# Patient Record
Sex: Male | Born: 1955 | Race: White | Hispanic: No | Marital: Married
Health system: Southern US, Community
[De-identification: ages and names within clinical notes are randomized; demographics above are authoritative.]

---

## 2004-01-06 ENCOUNTER — Inpatient Hospital Stay (HOSPITAL_COMMUNITY): Admission: RE | Admit: 2004-01-06 | Discharge: 2004-01-08 | Payer: Self-pay | Admitting: Thoracic Surgery

## 2004-01-19 ENCOUNTER — Encounter: Admission: RE | Admit: 2004-01-19 | Discharge: 2004-01-19 | Payer: Self-pay | Admitting: Thoracic Surgery

## 2005-10-05 IMAGING — CR DG CHEST 2V
2 series · 2 of 2 positions shown · non-contrast
Comparison: none

CLINICAL DATA: Hyperhydrosis.  Preadmission.  Patient for bilateral VATS procedures.  The patient is a nonsmoker.  No chest complaints.  
 TWO VIEW CHEST 
 PA and lateral views of the chest are made without previous films for comparison and show the heart, mediastinum, bony thorax, pulmonary vessels, trachea and bronchi to be within the normal limits.  
 IMPRESSION
 Normal chest without previous films for comparison.

[view not recorded (1 of 2)]
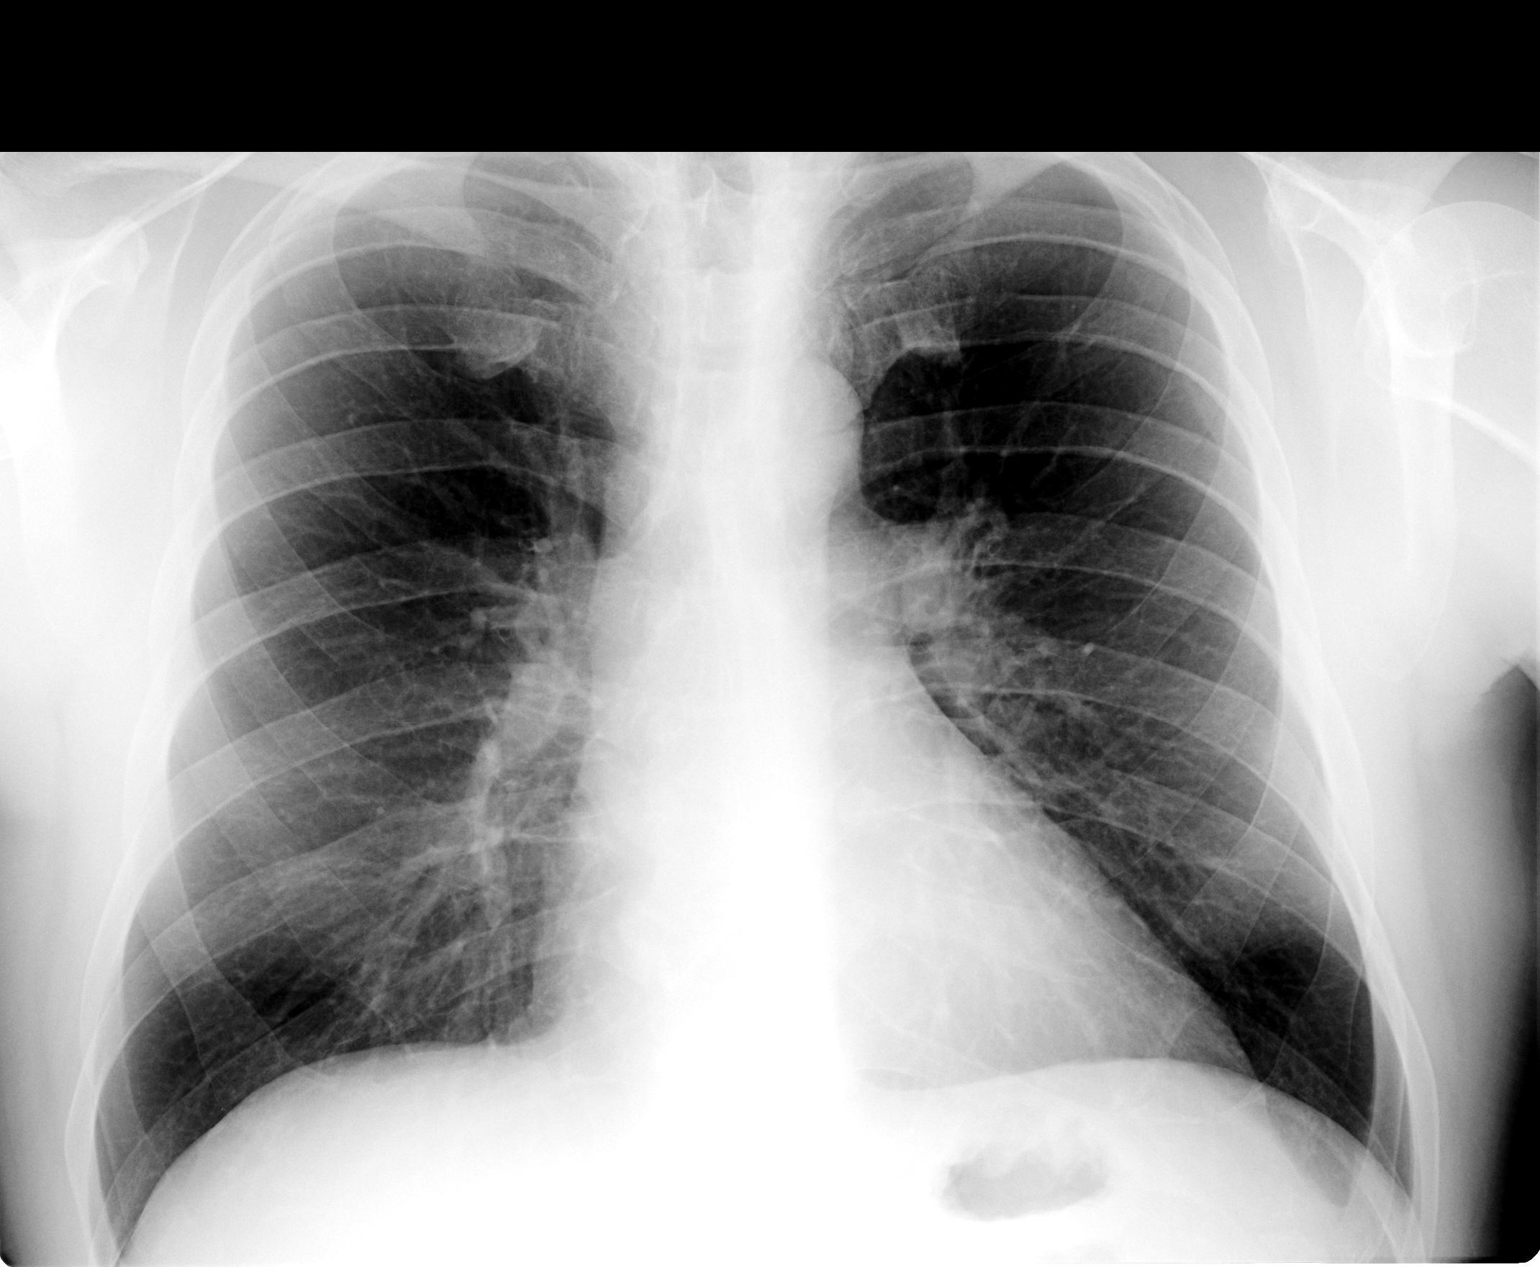

[view not recorded (2 of 2)]
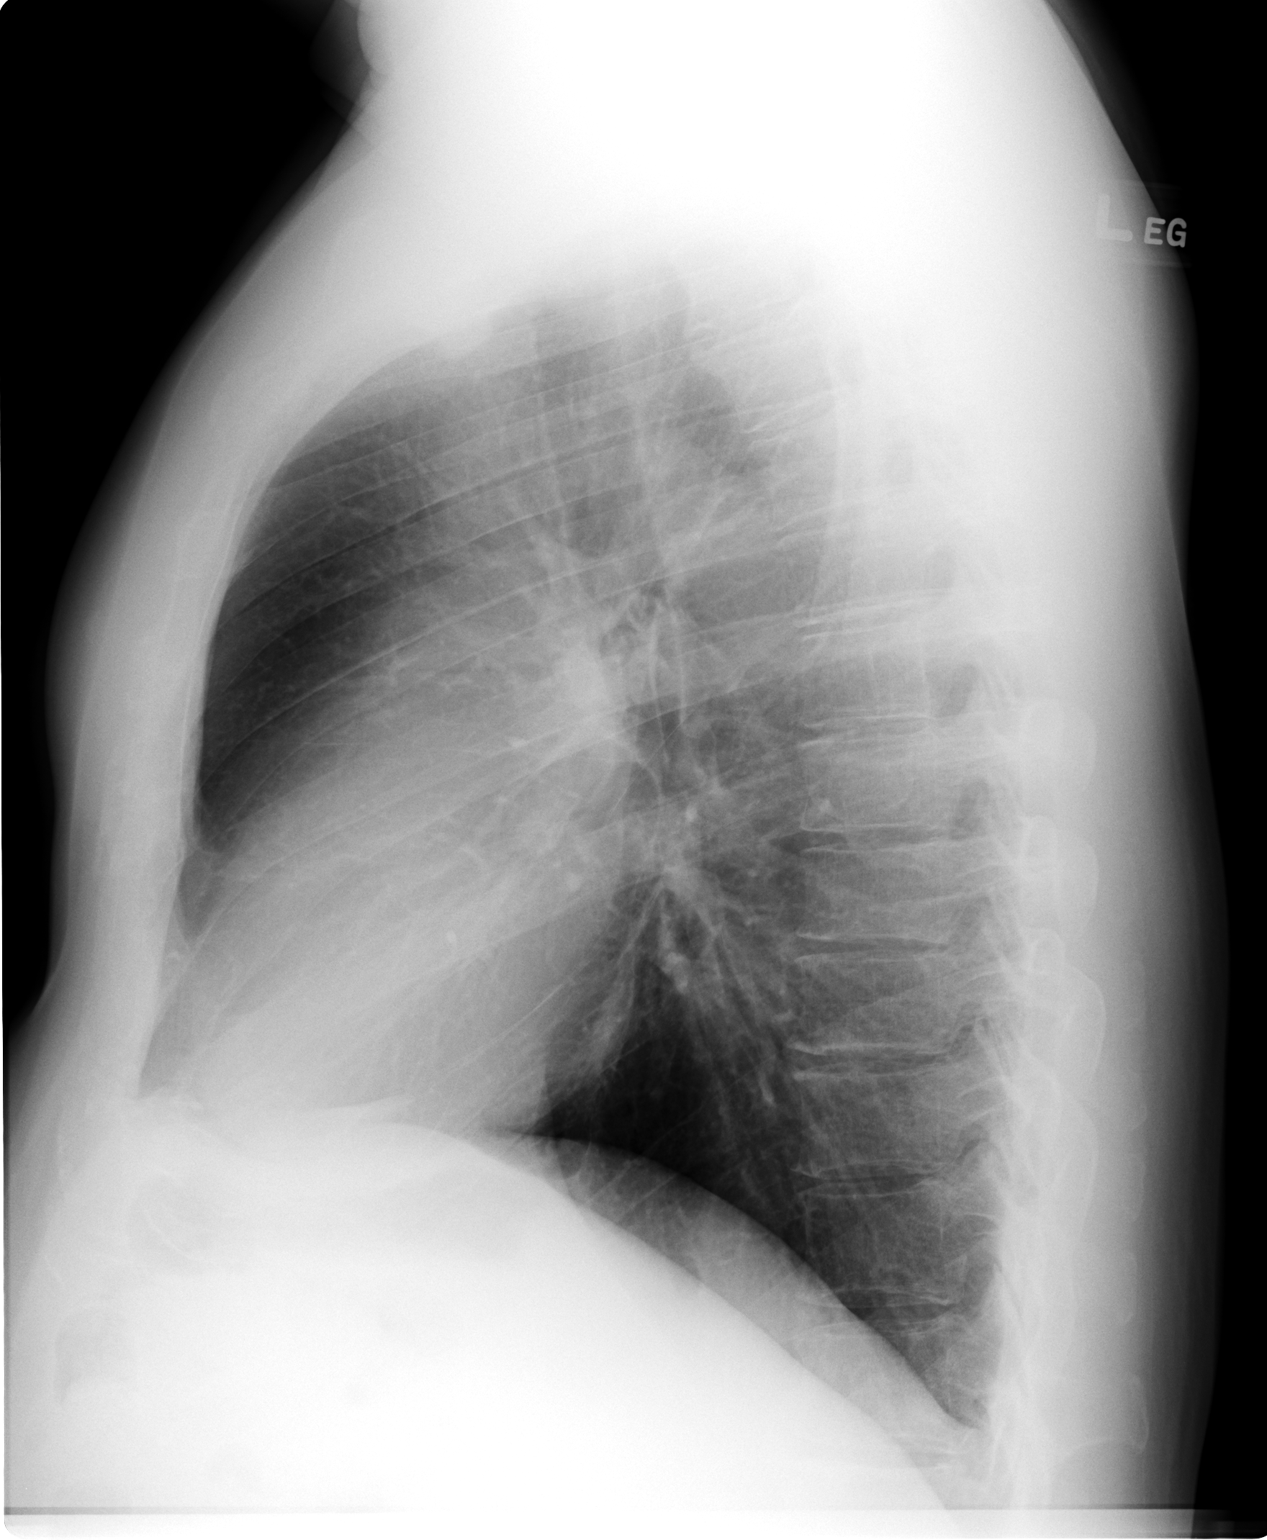

[2 of 2 positions shown; findings below may reference images not displayed]

## 2005-10-09 IMAGING — CR DG CHEST 2V
2 series · 2 of 2 positions shown · non-contrast
Comparison: 01/07/04.

CLINICAL DATA: Status post VATS.  
 CHEST (TWO VIEWS) 01/08/04

[view not recorded (1 of 2)]
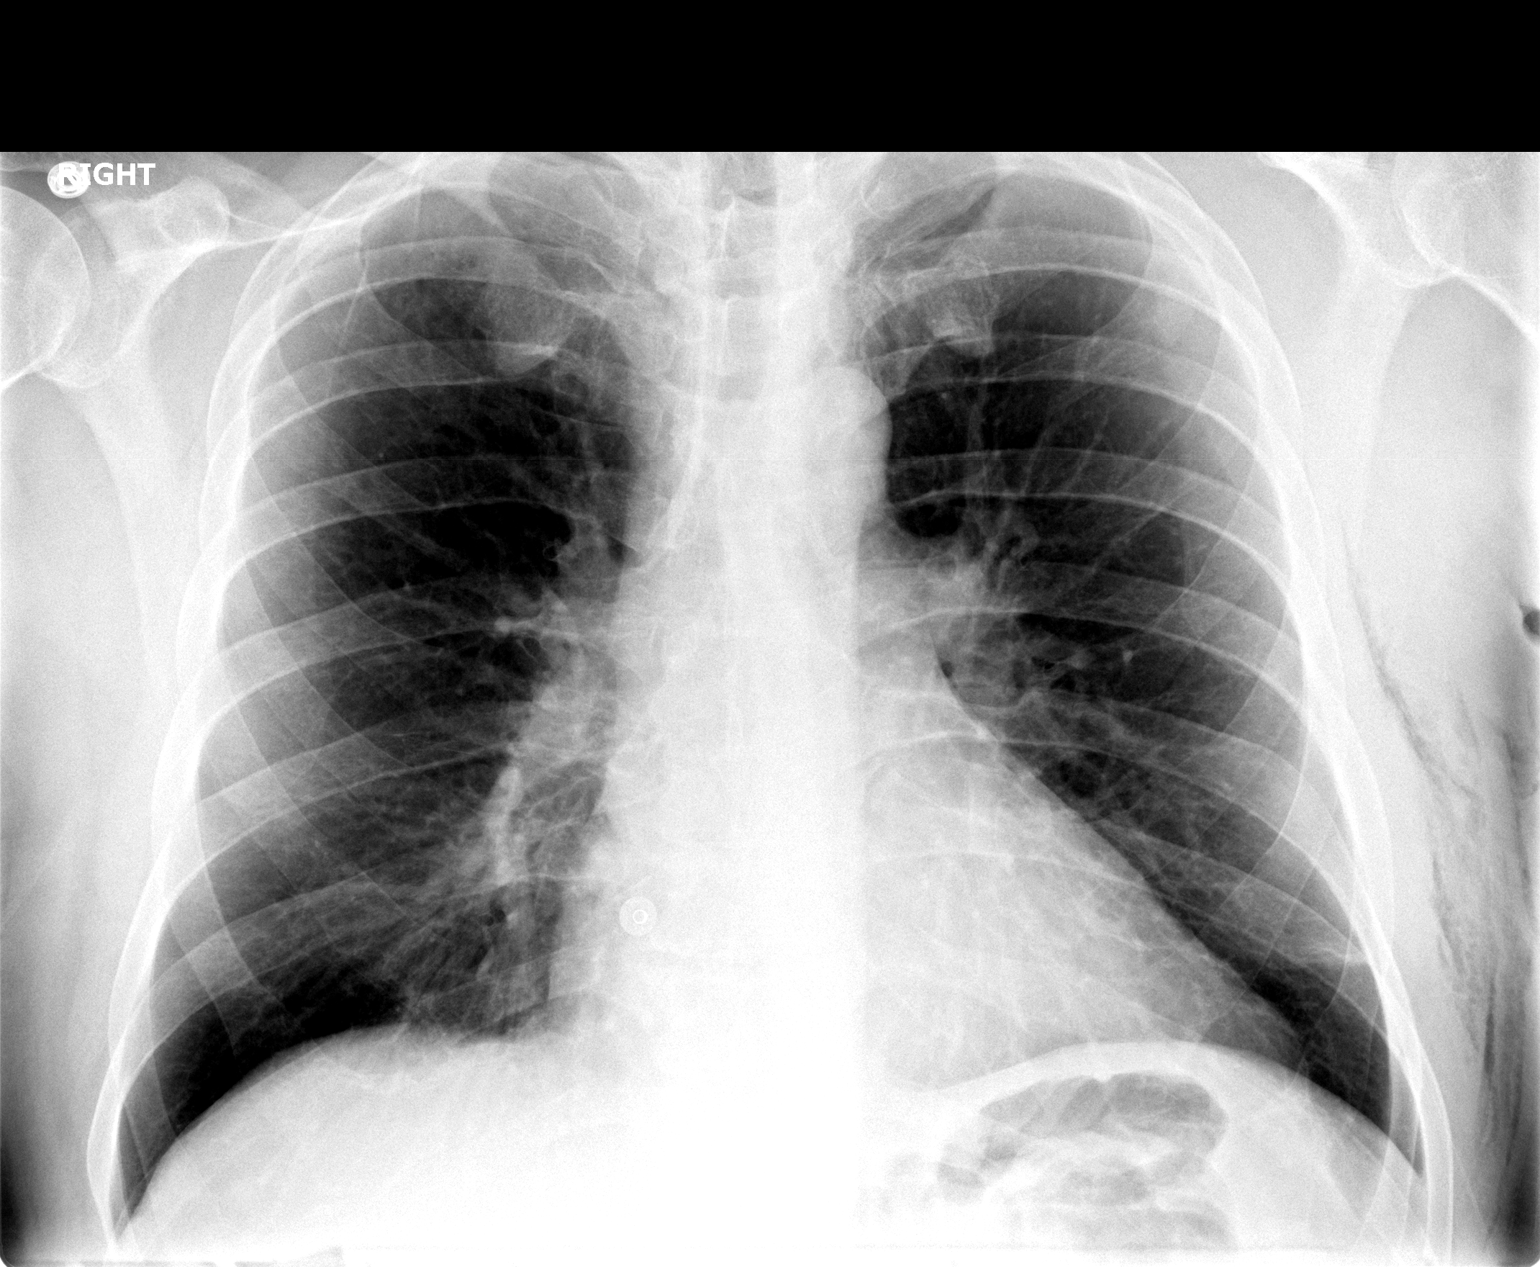

[view not recorded (2 of 2)]
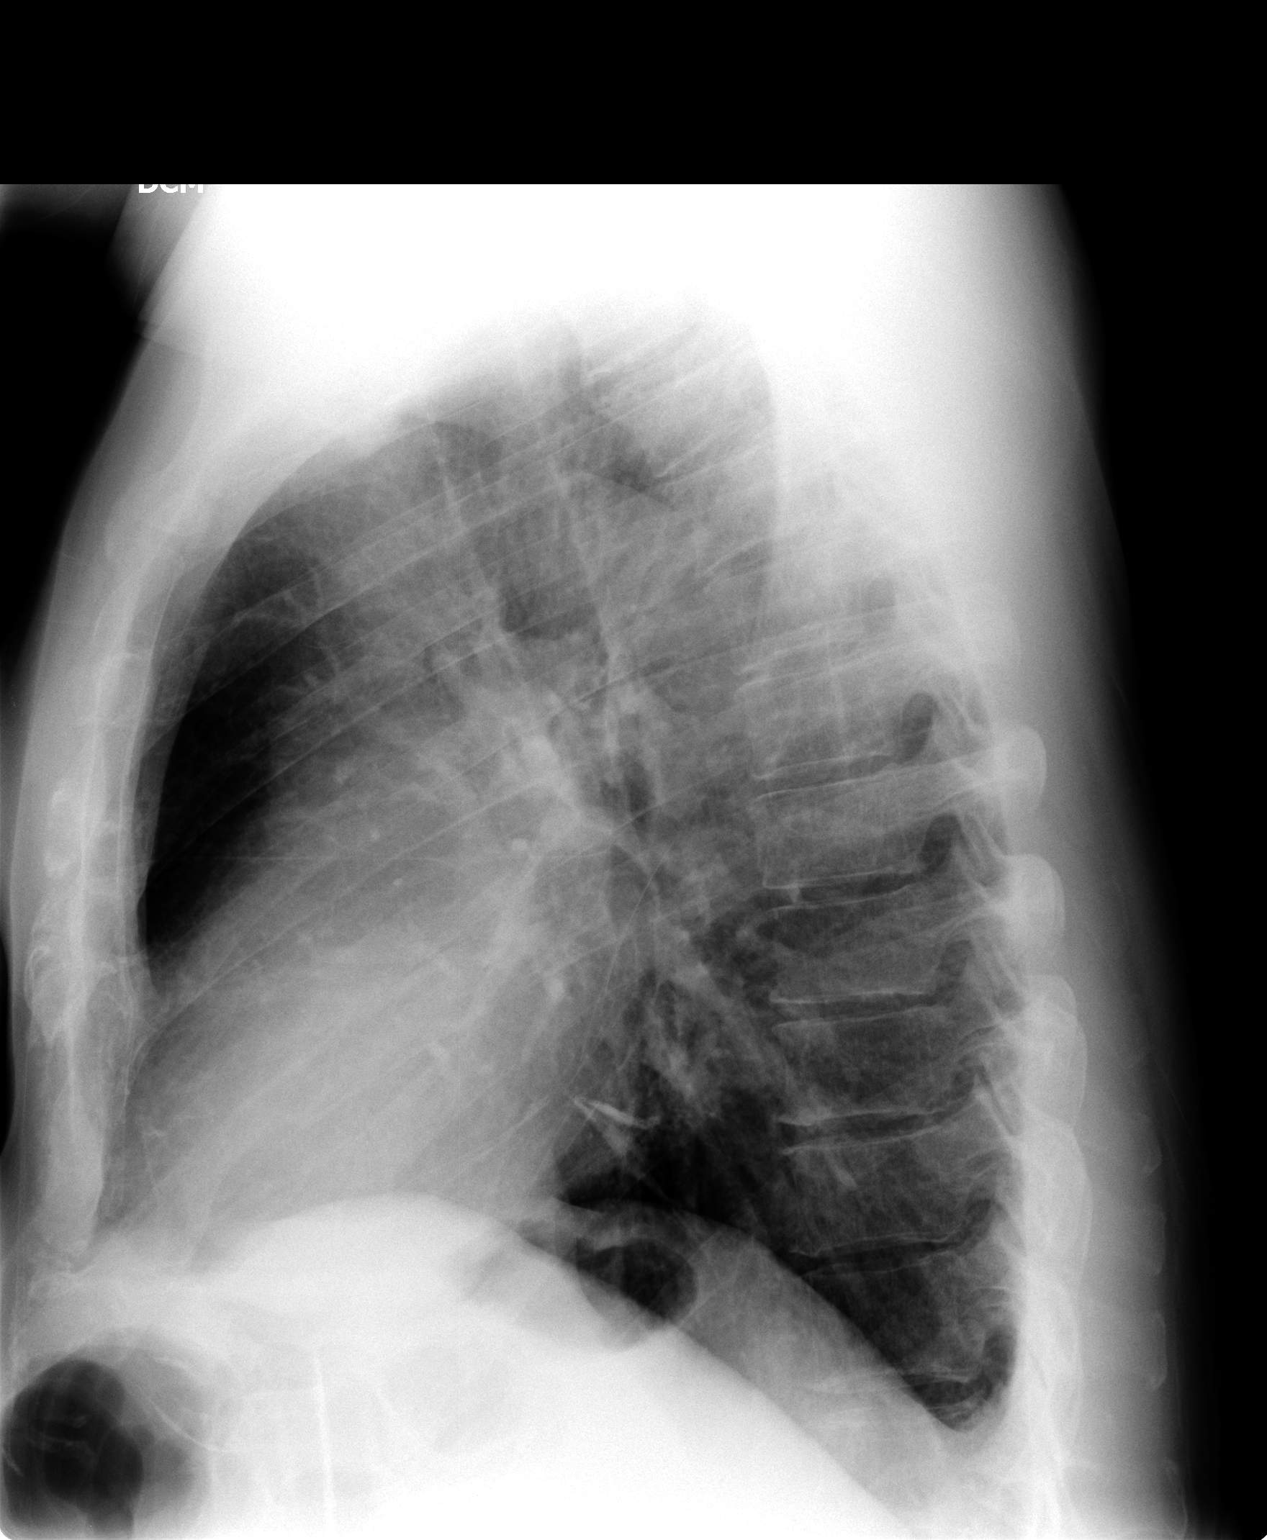

[2 of 2 positions shown; findings below may reference images not displayed]

No pneumothorax.  Stable heart size.  No edema or effusions.  Small amount of subcutaneous air remains in the left lateral chest wall.  
 IMPRESSION
 No pneumothorax or other acute findings.

## 2018-11-16 ENCOUNTER — Telehealth: Payer: Self-pay | Admitting: Orthotics

## 2018-11-16 DIAGNOSIS — M6788 Other specified disorders of synovium and tendon, other site: Secondary | ICD-10-CM

## 2018-11-25 NOTE — Telephone Encounter (Signed)
Called and gave update on back order for brace.
